# Patient Record
Sex: Male | Born: 2000 | Race: Black or African American | Hispanic: No | Marital: Single
Health system: Southern US, Community
[De-identification: ages and names within clinical notes are randomized; demographics above are authoritative.]

## PROBLEM LIST (undated history)

## (undated) DIAGNOSIS — S060XAA Concussion with loss of consciousness status unknown, initial encounter: Secondary | ICD-10-CM

## (undated) DIAGNOSIS — J45909 Unspecified asthma, uncomplicated: Secondary | ICD-10-CM

## (undated) DIAGNOSIS — S060X9A Concussion with loss of consciousness of unspecified duration, initial encounter: Secondary | ICD-10-CM

---
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
## 2021-12-31 ENCOUNTER — Ambulatory Visit
Admission: EM | Admit: 2021-12-31 | Discharge: 2021-12-31 | Disposition: A | Discharge status: Home / Self Care | Payer: Medicaid Other | Attending: Medical Oncology | Admitting: Medical Oncology

## 2021-12-31 ENCOUNTER — Other Ambulatory Visit: Payer: Self-pay

## 2021-12-31 ENCOUNTER — Encounter: Payer: Self-pay | Admitting: Emergency Medicine

## 2021-12-31 DIAGNOSIS — J029 Acute pharyngitis, unspecified: Secondary | ICD-10-CM | POA: Insufficient documentation

## 2021-12-31 LAB — GROUP A STREP BY PCR: Group A Strep by PCR: NOT DETECTED

## 2021-12-31 LAB — MONONUCLEOSIS SCREEN: Mono Screen: NEGATIVE

## 2021-12-31 MED ORDER — AZITHROMYCIN 250 MG PO TABS: 250.0000 mg | ORAL_TABLET | Freq: Every day | ORAL | 0 refills | Status: AC

## 2021-12-31 MED ORDER — CEFTRIAXONE SODIUM 500 MG IJ SOLR
500.0000 mg | Freq: Once | INTRAMUSCULAR | Status: AC
  Administered 2021-12-31: 500 mg via INTRAMUSCULAR

## 2021-12-31 NOTE — ED Triage Notes (Signed)
Patient c/o sore throat and headache that started 6 days ago.  Patient reports chills but denies fevers.

## 2021-12-31 NOTE — ED Provider Notes (Signed)
[facility]    CSN: [id number] Arrival date & time: 12/31/21  1250      History   Chief Complaint Chief Complaint  Patient presents with   Sore Throat   Headache    HPI Johnny Figueroa is a 21 y.o. male.   HPI  Sore Throat: Patient states that for the past few days he has had a very bad sore throat along with headache and body aches and chills. No cough. No vomiting or abdominal pain but has had nausea. He has tried OTC cough and cold medication without relief. No known sick contacts.   Past Medical History:  Diagnosis Date   Asthma    Concussion     Patient Active Problem List   Diagnosis Date Noted   MDD (major depressive disorder), recurrent severe, without psychosis ([place]) 02/23/2018   ADHD (attention deficit hyperactivity disorder), combined type 02/23/2018   Suicide attempt by hanging ([place]) 02/23/2018   MDD (major depressive disorder), severe ([place]) 02/22/2018    History reviewed. No pertinent surgical history.     Home Medications    Prior to Admission medications   Medication Sig Start Date End Date Taking? Authorizing Provider  ARIPiprazole (ABILIFY) 15 MG tablet Take 1 tablet (15 mg total) by mouth daily. 02/27/18   Mordecai Maes, NP  methylphenidate 36 MG PO CR tablet Take 1 tablet (36 mg total) by mouth every morning. 02/28/18   Mordecai Maes, NP  sertraline (ZOLOFT) 50 MG tablet Take 1 tablet (50 mg total) by mouth at bedtime. 02/27/18   Mordecai Maes, NP  sulfamethoxazole-trimethoprim (BACTRIM DS) 800-160 MG tablet Take 1 tablet by mouth 2 (two) times daily. 02/23/20   Laban Emperor, PA-C  traZODone (DESYREL) 100 MG tablet Take 1 tablet (100 mg total) by mouth at bedtime. 02/27/18   Mordecai Maes, NP    Family History History reviewed. No pertinent family history.  Social History Social History   Tobacco Use   Smoking status: Passive Smoke Exposure - Never Smoker   Smokeless tobacco: Never  Vaping Use   Vaping Use: Some days   Substance Use Topics   Alcohol use: No   Drug use: Yes    Types: Marijuana     Allergies   Pineapple and Shrimp [shellfish allergy]   Review of Systems Review of Systems  As stated above in HPI Physical Exam Triage Vital Signs ED Triage Vitals  Enc Vitals Group     BP 12/31/21 1508 131/82     Pulse Rate 12/31/21 1508 64     Resp 12/31/21 1508 15     Temp 12/31/21 1508 97.6 F (36.4 C)     Temp Source 12/31/21 1508 Oral     SpO2 12/31/21 1508 100 %     Weight 12/31/21 1506 130 lb (59 kg)     Height 12/31/21 1506 5\' 3"  (1.6 m)     Head Circumference --      Peak Flow --      Pain Score 12/31/21 1506 8     Pain Loc --      Pain Edu? --      Excl. in [place]? --    No data found.  Updated Vital Signs BP 131/82 (BP Location: Left Arm)    Pulse 64    Temp 97.6 F (36.4 C) (Oral)    Resp 15    Ht 5\' 3"  (1.6 m)    Wt 130 lb (59 kg)    SpO2 100%  BMI 23.03 kg/m   Physical Exam Vitals and nursing note reviewed.  Constitutional:      General: He is not in acute distress.    Appearance: He is well-developed. He is diaphoretic. He is not ill-appearing or toxic-appearing.  HENT:     Head: Normocephalic and atraumatic.     Right Ear: Tympanic membrane normal. No middle ear effusion. Tympanic membrane is not erythematous.     Left Ear: Tympanic membrane normal.  No middle ear effusion. Tympanic membrane is not erythematous.     Nose: No congestion or rhinorrhea.     Mouth/Throat:     Pharynx: Uvula midline. Oropharyngeal exudate and posterior oropharyngeal erythema present.     Tonsils: Tonsillar exudate (yellow/white/green) present. 3+ on the right. 3+ on the left.  Cardiovascular:     Rate and Rhythm: Normal rate and regular rhythm.  Pulmonary:     Effort: Pulmonary effort is normal.     Breath sounds: Normal breath sounds.  Abdominal:     Palpations: Abdomen is soft.  Musculoskeletal:     Cervical back: Neck supple.  Lymphadenopathy:     Cervical: Cervical  adenopathy present.  Skin:    General: Skin is warm.  Neurological:     Mental Status: He is alert and oriented to person, place, and time.     UC Treatments / Results  Labs (all labs ordered are listed, but only abnormal results are displayed) Labs Reviewed  GROUP A STREP BY PCR    EKG   Radiology No results found.  Procedures Procedures (including critical care time)  Medications Ordered in UC Medications - No data to display  Initial Impression / Assessment and Plan / UC Course  I have reviewed the triage vital signs and the nursing notes.  Pertinent labs & imaging results that were available during my care of the patient were reviewed by me and considered in my medical decision making (see chart for details).     New.  Rapid strep is negative.  Mono testing is negative.  Concern for [facility] given exam and testing results.  Swab pending while we treat given the extent of his symptoms.  Discussed with patient.  Follow-up with ENT as needed. Rest, hydration with water.  Final Clinical Impressions(s) / UC Diagnoses   Final diagnoses:  None   Discharge Instructions   None    ED Prescriptions   None    PDMP not reviewed this encounter.   Hughie Closs, [place] 12/31/21 1625

## 2022-01-01 LAB — CHLAMYDIA/NGC RT PCR (ARMC ONLY)
Chlamydia Tr: NOT DETECTED
N gonorrhoeae: NOT DETECTED

## 2023-02-06 ENCOUNTER — Emergency Department
Admission: EM | Admit: 2023-02-06 | Discharge: 2023-02-06 | Disposition: A | Discharge status: Home / Self Care | Payer: Medicaid Other | Attending: Emergency Medicine | Admitting: Emergency Medicine

## 2023-02-06 ENCOUNTER — Other Ambulatory Visit: Payer: Self-pay

## 2023-02-06 DIAGNOSIS — S39012A Strain of muscle, fascia and tendon of lower back, initial encounter: Secondary | ICD-10-CM

## 2023-02-06 DIAGNOSIS — J45909 Unspecified asthma, uncomplicated: Secondary | ICD-10-CM | POA: Insufficient documentation

## 2023-02-06 DIAGNOSIS — W2209XA Striking against other stationary object, initial encounter: Secondary | ICD-10-CM | POA: Insufficient documentation

## 2023-02-06 DIAGNOSIS — J069 Acute upper respiratory infection, unspecified: Secondary | ICD-10-CM | POA: Diagnosis not present

## 2023-02-06 DIAGNOSIS — Z1152 Encounter for screening for COVID-19: Secondary | ICD-10-CM | POA: Insufficient documentation

## 2023-02-06 DIAGNOSIS — Y990 Civilian activity done for income or pay: Secondary | ICD-10-CM | POA: Insufficient documentation

## 2023-02-06 DIAGNOSIS — M545 Low back pain, unspecified: Secondary | ICD-10-CM | POA: Diagnosis present

## 2023-02-06 DIAGNOSIS — F17210 Nicotine dependence, cigarettes, uncomplicated: Secondary | ICD-10-CM | POA: Insufficient documentation

## 2023-02-06 LAB — RESP PANEL BY RT-PCR (RSV, FLU A&B, COVID)  RVPGX2
Influenza A by PCR: NEGATIVE
Influenza B by PCR: NEGATIVE
Resp Syncytial Virus by PCR: NEGATIVE
SARS Coronavirus 2 by RT PCR: NEGATIVE

## 2023-02-06 LAB — GROUP A STREP BY PCR: Group A Strep by PCR: NOT DETECTED

## 2023-02-06 NOTE — Discharge Instructions (Signed)
Take acetaminophen 650 mg and ibuprofen 400 mg every 6 hours for pain.  Take with food.  Thank you for choosing us for your health care today!  Please see your primary doctor this week for a follow up appointment.   Sometimes, in the early stages of certain disease courses it is difficult to detect in the emergency department evaluation -- so, it is important that you continue to monitor your symptoms and call your doctor right away or return to the emergency department if you develop any new or worsening symptoms.  Please go to the following website to schedule new (and existing) patient appointments:   [email or web address]  If you do not have a primary doctor try calling the following clinics to establish care:  If you have insurance:  [facility] [phone] [address] [place] [address]   [facility]  [phone] [address] [place] [address] [place] [address]   If you do not have insurance:  [facility]  [phone] [address] [place] [address]   The following is another list of primary care offices in the area who are accepting new patients at this time.  Please reach out to one of them directly and let them know you would like to schedule an appointment to follow up on an Emergency Department visit, and/or to establish a new primary care provider (PCP).  There are likely other primary care clinics in the are who are accepting new patients, but this is an excellent place to start:  [facility] Lead physician: Dr Angela Bacigalupo [address] [place], [place] [address] [phone]  [facility] Lead Physician: Dr Krichna Sowles [address], [place], [place] [address] [phone]  [facility]  Lead Physician: Dr Megan Johnson [address], [place], [place] [address] [phone]  [facility] [place] [facility] Lead Physician: Dr Alex  Karamalegos [address], [place], [place] [address] [phone]  [facility] [place] Lead Physician: Dr Laura Berglund [address], [place],  [address] [phone]   It was my pleasure to care for you today.   Ardella Chhim S. Josep Luviano, MD  
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
## 2024-03-27 ENCOUNTER — Other Ambulatory Visit: Payer: Self-pay

## 2024-03-27 ENCOUNTER — Emergency Department
Admission: EM | Admit: 2024-03-27 | Discharge: 2024-03-27 | Disposition: A | Discharge status: Home / Self Care | Attending: Emergency Medicine | Admitting: Emergency Medicine

## 2024-03-27 ENCOUNTER — Encounter: Payer: Self-pay | Admitting: Intensive Care

## 2024-03-27 DIAGNOSIS — X500XXA Overexertion from strenuous movement or load, initial encounter: Secondary | ICD-10-CM | POA: Insufficient documentation

## 2024-03-27 DIAGNOSIS — Y990 Civilian activity done for income or pay: Secondary | ICD-10-CM | POA: Diagnosis not present

## 2024-03-27 DIAGNOSIS — M546 Pain in thoracic spine: Secondary | ICD-10-CM | POA: Insufficient documentation

## 2024-03-27 DIAGNOSIS — M549 Dorsalgia, unspecified: Secondary | ICD-10-CM

## 2024-03-27 MED ORDER — CYCLOBENZAPRINE HCL 10 MG PO TABS: 10.0000 mg | ORAL_TABLET | Freq: Three times a day (TID) | ORAL | 0 refills | Status: AC | PRN

## 2024-03-27 NOTE — Discharge Instructions (Signed)
You were seen in the emergency department today for evaluation of your back pain. Fortunately, your exam here is reassuring. You can take Tylenol and Ibuprofen to help with your pain, unless there is another reason you should not take these. You can also try lidocaine patches that are available over-the-counter.   We will send a prescription for a muscle relaxer to your pharmacy. Do not drive or operate machinery when taking this medication. Follow-up with your primary care provider within a few days for reevaluation. Return to the ER for any worsening symptoms including numbness, tingling, weakness, bowel or bladder incontinence, or any other new or concerning symptoms.
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
